# Patient Record
Sex: Female | Born: 1945 | Race: White | Hispanic: No | Marital: Married | Smoking: Never smoker
Health system: Southern US, Community
[De-identification: ages and names within clinical notes are randomized; demographics above are authoritative.]

## PROBLEM LIST (undated history)

## (undated) DIAGNOSIS — K219 Gastro-esophageal reflux disease without esophagitis: Secondary | ICD-10-CM

## (undated) DIAGNOSIS — M1990 Unspecified osteoarthritis, unspecified site: Secondary | ICD-10-CM

## (undated) DIAGNOSIS — E7800 Pure hypercholesterolemia, unspecified: Secondary | ICD-10-CM

## (undated) DIAGNOSIS — I10 Essential (primary) hypertension: Secondary | ICD-10-CM

## (undated) DIAGNOSIS — E039 Hypothyroidism, unspecified: Secondary | ICD-10-CM

## (undated) DIAGNOSIS — N200 Calculus of kidney: Secondary | ICD-10-CM

## (undated) HISTORY — PX: TUBAL LIGATION: SHX77

## (undated) HISTORY — PX: ABDOMINAL HYSTERECTOMY: SHX81

## (undated) HISTORY — PX: KIDNEY STONE SURGERY: SHX686

## (undated) HISTORY — DX: Hypothyroidism, unspecified: E03.9

## (undated) HISTORY — PX: CARPAL TUNNEL RELEASE: SHX101

## (undated) HISTORY — PX: APPENDECTOMY: SHX54

## (undated) HISTORY — DX: Essential (primary) hypertension: I10

## (undated) HISTORY — DX: Pure hypercholesterolemia, unspecified: E78.00

## (undated) HISTORY — DX: Gastro-esophageal reflux disease without esophagitis: K21.9

## (undated) HISTORY — PX: CHOLECYSTECTOMY: SHX55

## (undated) SURGERY — EGD (ESOPHAGOGASTRODUODENOSCOPY)
Anesthesia: Moderate Sedation

---

## 2018-04-25 ENCOUNTER — Encounter (INDEPENDENT_AMBULATORY_CARE_PROVIDER_SITE_OTHER): Payer: Self-pay | Admitting: *Deleted

## 2018-04-25 ENCOUNTER — Ambulatory Visit (INDEPENDENT_AMBULATORY_CARE_PROVIDER_SITE_OTHER): Payer: Medicare Other | Admitting: Internal Medicine

## 2018-04-25 ENCOUNTER — Encounter (INDEPENDENT_AMBULATORY_CARE_PROVIDER_SITE_OTHER): Payer: Self-pay | Admitting: Internal Medicine

## 2018-04-25 VITALS — BP 172/80 | HR 64 | Temp 97.9°F | Ht 60.0 in | Wt 141.1 lb

## 2018-04-25 DIAGNOSIS — E7800 Pure hypercholesterolemia, unspecified: Secondary | ICD-10-CM

## 2018-04-25 DIAGNOSIS — E039 Hypothyroidism, unspecified: Secondary | ICD-10-CM

## 2018-04-25 DIAGNOSIS — K219 Gastro-esophageal reflux disease without esophagitis: Secondary | ICD-10-CM | POA: Diagnosis not present

## 2018-04-25 DIAGNOSIS — I10 Essential (primary) hypertension: Secondary | ICD-10-CM | POA: Insufficient documentation

## 2018-04-25 DIAGNOSIS — R1310 Dysphagia, unspecified: Secondary | ICD-10-CM

## 2018-04-25 DIAGNOSIS — R1319 Other dysphagia: Secondary | ICD-10-CM

## 2018-04-25 HISTORY — DX: Pure hypercholesterolemia, unspecified: E78.00

## 2018-04-25 HISTORY — DX: Hypothyroidism, unspecified: E03.9

## 2018-04-25 HISTORY — DX: Gastro-esophageal reflux disease without esophagitis: K21.9

## 2018-04-25 HISTORY — DX: Essential (primary) hypertension: I10

## 2018-04-25 MED ORDER — PANTOPRAZOLE SODIUM 40 MG PO TBEC: 40.0000 mg | DELAYED_RELEASE_TABLET | Freq: Every day | ORAL | 1 refills | Status: AC

## 2018-04-25 NOTE — Patient Instructions (Addendum)
Rx for Protonix 40mg  daily. Take Zantac at hs. DG Esophagram.

## 2018-04-25 NOTE — Progress Notes (Signed)
   Subjective:    Patient ID: Lauren Smith, female    DOB: 01-09-1946, 72 y.o.   MRN: [id number]  HPI Referred by Olena Mater for GERD/colon polyps. Her last colonoscopy was in December of 2014 for hx of colon polyps. (Dr. Davina Poke)   Normal colonoscopy. Family hx of colon cancer in father age 55. She tells me she has burning in her epigastric region. GERD is more frequent. She says sometimes foods are lodging at time. She says her esophagus feel irritated.  Symptoms gradually worsened over the years.  Her appetite is okay. No weight loss. Has a BM daily. Sometimes she does have cramps with her BMs. No melena or BRRB.   Retired from [employer] Past Medical History:  Diagnosis Date  . GERD (gastroesophageal reflux disease) 04/25/2018  . High blood pressure 04/25/2018  . High cholesterol 04/25/2018  . Hypothyroidism 04/25/2018    Past Surgical History:  Procedure Laterality Date  . ABDOMINAL HYSTERECTOMY     One ovary left.   . APPENDECTOMY     Carcinold  . CARPAL TUNNEL RELEASE     bilateral   . CHOLECYSTECTOMY    . KIDNEY STONE SURGERY     lithro  . TUBAL LIGATION      Allergies  Allergen Reactions  . Penicillins     hives    Current Outpatient Medications on File Prior to Visit  Medication Sig Dispense Refill  . b complex vitamins capsule Take 1 capsule by mouth daily.    . Cholecalciferol (VITAMIN D3) 1000 units CAPS Take 5,000 Units by mouth.    . Coenzyme Q10 10 MG capsule Take 10 mg by mouth daily.    Marland Kitchen LACTOBACILLUS BIFIDUS PO Take by mouth.    . levothyroxine (SYNTHROID, LEVOTHROID) 75 MCG tablet Take 88 mcg by mouth daily before breakfast.     . lisinopril (PRINIVIL,ZESTRIL) 10 MG tablet Take 10 mg by mouth daily.    . Multiple Vitamin (MULTIVITAMIN) tablet Take 1 tablet by mouth daily.    . Multiple Vitamins-Minerals (OCUVITE ADULT 50+ PO) Take by mouth.    . ranitidine (ZANTAC) 150 MG tablet Take 150 mg by mouth 2 (two)  times daily.    . Red Yeast Rice Extract 600 MG CAPS Take by mouth.     No current facility-administered medications on file prior to visit.         Objective:   Physical Exam Blood pressure (!) 200/80, pulse 64, temperature 97.9 F (36.6 C), height 5' (1.524 m), weight 141 lb 1.6 oz (64 kg). Alert and oriented. Skin warm and dry. Oral mucosa is moist.   . Sclera anicteric, conjunctivae is pink. Thyroid not enlarged. No cervical lymphadenopathy. Lungs clear. Heart regular rate and rhythm.  Abdomen is soft. Bowel sounds are positive. No hepatomegaly. No abdominal masses felt. No tenderness.  No edema to lower extremities.          Assessment & Plan:  Dysphagia.  Am going to get a DG esophagram. Am going to switch her to Protonix. Can take Zantac at night.  Will get DG esophagram first before we schedule the colonoscopy. May need an EGD/ED.

## 2018-04-29 ENCOUNTER — Ambulatory Visit (HOSPITAL_COMMUNITY): Payer: Medicare Other

## 2018-05-05 ENCOUNTER — Ambulatory Visit (HOSPITAL_COMMUNITY)
Admission: RE | Admit: 2018-05-05 | Discharge: 2018-05-05 | Disposition: A | Discharge status: Home / Self Care | Payer: Medicare Other | Source: Ambulatory Visit | Attending: Internal Medicine | Admitting: Internal Medicine

## 2018-05-05 ENCOUNTER — Other Ambulatory Visit (INDEPENDENT_AMBULATORY_CARE_PROVIDER_SITE_OTHER): Payer: Self-pay | Admitting: Internal Medicine

## 2018-05-05 ENCOUNTER — Encounter (HOSPITAL_COMMUNITY): Payer: Self-pay | Admitting: Radiology

## 2018-05-05 ENCOUNTER — Encounter (INDEPENDENT_AMBULATORY_CARE_PROVIDER_SITE_OTHER): Payer: Self-pay | Admitting: *Deleted

## 2018-05-05 DIAGNOSIS — R1310 Dysphagia, unspecified: Secondary | ICD-10-CM

## 2018-05-05 DIAGNOSIS — K222 Esophageal obstruction: Secondary | ICD-10-CM | POA: Diagnosis not present

## 2018-05-05 DIAGNOSIS — R1319 Other dysphagia: Secondary | ICD-10-CM

## 2018-05-05 DIAGNOSIS — K449 Diaphragmatic hernia without obstruction or gangrene: Secondary | ICD-10-CM | POA: Diagnosis not present

## 2018-05-05 DIAGNOSIS — R933 Abnormal findings on diagnostic imaging of other parts of digestive tract: Secondary | ICD-10-CM

## 2018-05-05 DIAGNOSIS — Z860100 Personal history of colon polyps, unspecified: Secondary | ICD-10-CM

## 2018-05-05 NOTE — Telephone Encounter (Signed)
error    This encounter was created in error - please disregard.

## 2018-05-11 ENCOUNTER — Other Ambulatory Visit (INDEPENDENT_AMBULATORY_CARE_PROVIDER_SITE_OTHER): Payer: Self-pay | Admitting: Internal Medicine

## 2018-05-11 ENCOUNTER — Telehealth (INDEPENDENT_AMBULATORY_CARE_PROVIDER_SITE_OTHER): Payer: Self-pay | Admitting: *Deleted

## 2018-05-11 ENCOUNTER — Telehealth (INDEPENDENT_AMBULATORY_CARE_PROVIDER_SITE_OTHER): Payer: Self-pay | Admitting: Internal Medicine

## 2018-05-11 ENCOUNTER — Encounter (INDEPENDENT_AMBULATORY_CARE_PROVIDER_SITE_OTHER): Payer: Self-pay | Admitting: *Deleted

## 2018-05-11 DIAGNOSIS — R1310 Dysphagia, unspecified: Secondary | ICD-10-CM

## 2018-05-11 DIAGNOSIS — Z860100 Personal history of colon polyps, unspecified: Secondary | ICD-10-CM | POA: Insufficient documentation

## 2018-05-11 DIAGNOSIS — R933 Abnormal findings on diagnostic imaging of other parts of digestive tract: Secondary | ICD-10-CM | POA: Insufficient documentation

## 2018-05-11 MED ORDER — PEG 3350-KCL-NA BICARB-NACL 420 G PO SOLR: 4000.0000 mL | Freq: Once | ORAL | 0 refills | Status: AC

## 2018-05-11 NOTE — Telephone Encounter (Signed)
Patient needs trilyte 

## 2018-05-11 NOTE — Telephone Encounter (Signed)
EGD,possible ED, colonoscopy with propofol

## 2018-05-11 NOTE — Telephone Encounter (Signed)
TCS/EGD sch'd 06/17/18 1030 (900), preop 11/18 at 11, left detailed message for patient, instructions mailed

## 2018-06-07 NOTE — Patient Instructions (Signed)
Lauren Smith  06/07/2018     @PREFPERIOPPHARMACY @   Your procedure is scheduled on  06/17/2018 .  Report to [facility] at  700   A.M.  Call this number if you have problems the morning of surgery:  [phone]   Remember:  Follow the diet and prep instructions given to you by Dr Olevia Perches office.                      Take these medicines the morning of surgery with A SIP OF WATER  Levothyroxine, lisinopril, protonix, zantac.    Do not wear jewelry, make-up or nail polish.  Do not wear lotions, powders, or perfumes, or deodorant.  Do not shave 48 hours prior to surgery.  Men may shave face and neck.  Do not bring valuables to the hospital.  [facility] is not responsible for any belongings or valuables.  Contacts, dentures or bridgework may not be worn into surgery.  Leave your suitcase in the car.  After surgery it may be brought to your room.  For patients admitted to the hospital, discharge time will be determined by your treatment team.  Patients discharged the day of surgery will not be allowed to drive home.   Name and phone number of your driver:   family Special instructions:  None Please read over the following fact sheets that you were given. Anesthesia Post-op Instructions and Care and Recovery After Surgery       Esophagogastroduodenoscopy Esophagogastroduodenoscopy (EGD) is a procedure to examine the lining of the esophagus, stomach, and first part of the small intestine (duodenum). This procedure is done to check for problems such as inflammation, bleeding, ulcers, or growths. During this procedure, a long, flexible, lighted tube with a camera attached (endoscope) is inserted down the throat. Tell a health care provider about:  Any allergies you have.  All medicines you are taking, including vitamins, herbs, eye drops, creams, and over-the-counter medicines.  Any problems you or family members have had with anesthetic  medicines.  Any blood disorders you have.  Any surgeries you have had.  Any medical conditions you have.  Whether you are pregnant or may be pregnant. What are the risks? Generally, this is a safe procedure. However, problems may occur, including:  Infection.  Bleeding.  A tear (perforation) in the esophagus, stomach, or duodenum.  Trouble breathing.  Excessive sweating.  Spasms of the larynx.  A slowed heartbeat.  Low blood pressure.  What happens before the procedure?  Follow instructions from your health care provider about eating or drinking restrictions.  Ask your health care provider about: ? Changing or stopping your regular medicines. This is especially important if you are taking diabetes medicines or blood thinners. ? Taking medicines such as aspirin and ibuprofen. These medicines can thin your blood. Do not take these medicines before your procedure if your health care provider instructs you not to.  Plan to have someone take you home after the procedure.  If you wear dentures, be ready to remove them before the procedure. What happens during the procedure?  To reduce your risk of infection, your health care team will wash or sanitize their hands.  An IV tube will be put in a vein in your hand or arm. You will get medicines and fluids through this tube.  You will be given one or more of the following: ?  A medicine to help you relax (sedative). ? A medicine to numb the area (local anesthetic). This medicine may be sprayed into your throat. It will make you feel more comfortable and keep you from gagging or coughing during the procedure. ? A medicine for pain.  A mouth guard may be placed in your mouth to protect your teeth and to keep you from biting on the endoscope.  You will be asked to lie on your left side.  The endoscope will be lowered down your throat into your esophagus, stomach, and duodenum.  Air will be put into the endoscope. This will  help your health care provider see better.  The lining of your esophagus, stomach, and duodenum will be examined.  Your health care provider may: ? Take a tissue sample so it can be looked at in a lab (biopsy). ? Remove growths. ? Remove objects (foreign bodies) that are stuck. ? Treat any bleeding with medicines or other devices that stop tissue from bleeding. ? Widen (dilate) or stretch narrowed areas of your esophagus and stomach.  The endoscope will be taken out. The procedure may vary among health care providers and hospitals. What happens after the procedure?  Your blood pressure, heart rate, breathing rate, and blood oxygen level will be monitored often until the medicines you were given have worn off.  Do not eat or drink anything until the numbing medicine has worn off and your gag reflex has returned. This information is not intended to replace advice given to you by your health care provider. Make sure you discuss any questions you have with your health care provider. Document Released: 11/13/2004 Document Revised: 12/19/2015 Document Reviewed: 06/06/2015 Elsevier Interactive Patient Education  2018 [employer]. Esophagogastroduodenoscopy, Care After Refer to this sheet in the next few weeks. These instructions provide you with information about caring for yourself after your procedure. Your health care provider may also give you more specific instructions. Your treatment has been planned according to current medical practices, but problems sometimes occur. Call your health care provider if you have any problems or questions after your procedure. What can I expect after the procedure? After the procedure, it is common to have:  A sore throat.  Nausea.  Bloating.  Dizziness.  Fatigue.  Follow these instructions at home:  Do not eat or drink anything until the numbing medicine (local anesthetic) has worn off and your gag reflex has returned. You will know that the  local anesthetic has worn off when you can swallow comfortably.  Do not drive for 24 hours if you received a medicine to help you relax (sedative).  If your health care provider took a tissue sample for testing during the procedure, make sure to get your test results. This is your responsibility. Ask your health care provider or the department performing the test when your results will be ready.  Keep all follow-up visits as told by your health care provider. This is important. Contact a health care provider if:  You cannot stop coughing.  You are not urinating.  You are urinating less than usual. Get help right away if:  You have trouble swallowing.  You cannot eat or drink.  You have throat or chest pain that gets worse.  You are dizzy or light-headed.  You faint.  You have nausea or vomiting.  You have chills.  You have a fever.  You have severe abdominal pain.  You have black, tarry, or bloody stools. This information is not intended to  replace advice given to you by your health care provider. Make sure you discuss any questions you have with your health care provider. Document Released: 06/29/2012 Document Revised: 12/19/2015 Document Reviewed: 06/06/2015 Elsevier Interactive Patient Education  2018 [employer].  Esophageal Dilatation Esophageal dilatation is a procedure to open a blocked or narrowed part of the esophagus. The esophagus is the long tube in your throat that carries food and liquid from your mouth to your stomach. The procedure is also called esophageal dilation. You may need this procedure if you have a buildup of scar tissue in your esophagus that makes it difficult, painful, or even impossible to swallow. This can be caused by gastroesophageal reflux disease (GERD). In rare cases, people need this procedure because they have cancer of the esophagus or a problem with the way food moves through the esophagus. Sometimes you may need to have another  dilatation to enlarge the opening of the esophagus gradually. Tell a health care provider about:  Any allergies you have.  All medicines you are taking, including vitamins, herbs, eye drops, creams, and over-the-counter medicines.  Any problems you or family members have had with anesthetic medicines.  Any blood disorders you have.  Any surgeries you have had.  Any medical conditions you have.  Any antibiotic medicines you are required to take before dental procedures. What are the risks? Generally, this is a safe procedure. However, problems can occur and include:  Bleeding from a tear in the lining of the esophagus.  A hole (perforation) in the esophagus.  What happens before the procedure?  Do not eat or drink anything after midnight on the night before the procedure or as directed by your health care provider.  Ask your health care provider about changing or stopping your regular medicines. This is especially important if you are taking diabetes medicines or blood thinners.  Plan to have someone take you home after the procedure. What happens during the procedure?  You will be given a medicine that makes you relaxed and sleepy (sedative).  A medicine may be sprayed or gargled to numb the back of the throat.  Your health care provider can use various instruments to do an esophageal dilatation. During the procedure, the instrument used will be placed in your mouth and passed down into your esophagus. Options include: ? Simple dilators. This instrument is carefully placed in the esophagus to stretch it. ? Guided wire bougies. In this method, a flexible tube (endoscope) is used to insert a wire into the esophagus. The dilator is passed over this wire to enlarge the esophagus. Then the wire is removed. ? Balloon dilators. An endoscope with a small balloon at the end is passed down into the esophagus. Inflating the balloon gently stretches the esophagus and opens it up. What  happens after the procedure?  Your blood pressure, heart rate, breathing rate, and blood oxygen level will be monitored often until the medicines you were given have worn off.  Your throat may feel slightly sore and will probably still feel numb. This will improve slowly over time.  You will not be allowed to eat or drink until the throat numbness has resolved.  If this is a same-day procedure, you may be allowed to go home once you have been able to drink, urinate, and sit on the edge of the bed without nausea or dizziness.  If this is a same-day procedure, you should have a friend or family member with you for the next 24 hours after  the procedure. This information is not intended to replace advice given to you by your health care provider. Make sure you discuss any questions you have with your health care provider. Document Released: 09/03/2005 Document Revised: 12/19/2015 Document Reviewed: 11/22/2013 Elsevier Interactive Patient Education  [employer].  Colonoscopy, Adult A colonoscopy is an exam to look at the large intestine. It is done to check for problems, such as:  Lumps (tumors).  Growths (polyps).  Swelling (inflammation).  Bleeding.  What happens before the procedure? Eating and drinking Follow instructions from your doctor about eating and drinking. These instructions may include:  A few days before the procedure - follow a low-fiber diet. ? Avoid nuts. ? Avoid seeds. ? Avoid dried fruit. ? Avoid raw fruits. ? Avoid vegetables.  1-3 days before the procedure - follow a clear liquid diet. Avoid liquids that have red or purple dye. Drink only clear liquids, such as: ? Clear broth or bouillon. ? Black coffee or tea. ? Clear juice. ? Clear soft drinks or sports drinks. ? Gelatin dessert. ? Popsicles.  On the day of the procedure - do not eat or drink anything during the 2 hours before the procedure.  Bowel prep If you were prescribed an oral bowel  prep:  Take it as told by your doctor. Starting the day before your procedure, you will need to drink a lot of liquid. The liquid will cause you to poop (have bowel movements) until your poop is almost clear or light green.  If your skin or butt gets irritated from diarrhea, you may: ? Wipe the area with wipes that have medicine in them, such as adult wet wipes with aloe and vitamin E. ? Put something on your skin that soothes the area, such as petroleum jelly.  If you throw up (vomit) while drinking the bowel prep, take a break for up to 60 minutes. Then begin the bowel prep again. If you keep throwing up and you cannot take the bowel prep without throwing up, call your doctor.  General instructions  Ask your doctor about changing or stopping your normal medicines. This is important if you take diabetes medicines or blood thinners.  Plan to have someone take you home from the hospital or clinic. What happens during the procedure?  An IV tube may be put into one of your veins.  You will be given medicine to help you relax (sedative).  To reduce your risk of infection: ? Your doctors will wash their hands. ? Your anal area will be washed with soap.  You will be asked to lie on your side with your knees bent.  Your doctor will get a long, thin, flexible tube ready. The tube will have a camera and a light on the end.  The tube will be put into your anus.  The tube will be gently put into your large intestine.  Air will be delivered into your large intestine to keep it open. You may feel some pressure or cramping.  The camera will be used to take photos.  A small tissue sample may be removed from your body to be looked at under a microscope (biopsy). If any possible problems are found, the tissue will be sent to a lab for testing.  If small growths are found, your doctor may remove them and have them checked for cancer.  The tube that was put into your anus will be slowly  removed. The procedure may vary among doctors and hospitals. What happens  after the procedure?  Your doctor will check on you often until the medicines you were given have worn off.  Do not drive for 24 hours after the procedure.  You may have a small amount of blood in your poop.  You may pass gas.  You may have mild cramps or bloating in your belly (abdomen).  It is up to you to get the results of your procedure. Ask your doctor, or the department performing the procedure, when your results will be ready. This information is not intended to replace advice given to you by your health care provider. Make sure you discuss any questions you have with your health care provider. Document Released: 08/15/2010 Document Revised: 05/13/2016 Document Reviewed: 09/24/2015 Elsevier Interactive Patient Education  2017 [employer]  Colonoscopy, Adult, Care After This sheet gives you information about how to care for yourself after your procedure. Your health care provider may also give you more specific instructions. If you have problems or questions, contact your health care provider. What can I expect after the procedure? After the procedure, it is common to have:  A small amount of blood in your stool for 24 hours after the procedure.  Some gas.  Mild abdominal cramping or bloating.  Follow these instructions at home: General instructions   For the first 24 hours after the procedure: ? Do not drive or use machinery. ? Do not sign important documents. ? Do not drink alcohol. ? Do your regular daily activities at a slower pace than normal. ? Eat soft, easy-to-digest foods. ? Rest often.  Take over-the-counter or prescription medicines only as told by your health care provider.  It is up to you to get the results of your procedure. Ask your health care provider, or the department performing the procedure, when your results will be ready. Relieving cramping and bloating  Try  walking around when you have cramps or feel bloated.  Apply heat to your abdomen as told by your health care provider. Use a heat source that your health care provider recommends, such as a moist heat pack or a heating pad. ? Place a towel between your skin and the heat source. ? Leave the heat on for 20-30 minutes. ? Remove the heat if your skin turns bright red. This is especially important if you are unable to feel pain, heat, or cold. You may have a greater risk of getting burned. Eating and drinking  Drink enough fluid to keep your urine clear or pale yellow.  Resume your normal diet as instructed by your health care provider. Avoid heavy or fried foods that are hard to digest.  Avoid drinking alcohol for as long as instructed by your health care provider. Contact a health care provider if:  You have blood in your stool 2-3 days after the procedure. Get help right away if:  You have more than a small spotting of blood in your stool.  You pass large blood clots in your stool.  Your abdomen is swollen.  You have nausea or vomiting.  You have a fever.  You have increasing abdominal pain that is not relieved with medicine. This information is not intended to replace advice given to you by your health care provider. Make sure you discuss any questions you have with your health care provider. Document Released: 02/25/2004 Document Revised: 04/06/2016 Document Reviewed: 09/24/2015 Elsevier Interactive Patient Education  2018 [place] Anesthesia is a term that refers to techniques, procedures, and medicines that  help a person stay safe and comfortable during a medical procedure. Monitored anesthesia care, or sedation, is one type of anesthesia. Your anesthesia specialist may recommend sedation if you will be having a procedure that does not require you to be unconscious, such as:  Cataract surgery.  A dental procedure.  A biopsy.  A  colonoscopy.  During the procedure, you may receive a medicine to help you relax (sedative). There are three levels of sedation:  Mild sedation. At this level, you may feel awake and relaxed. You will be able to follow directions.  Moderate sedation. At this level, you will be sleepy. You may not remember the procedure.  Deep sedation. At this level, you will be asleep. You will not remember the procedure.  The more medicine you are given, the deeper your level of sedation will be. Depending on how you respond to the procedure, the anesthesia specialist may change your level of sedation or the type of anesthesia to fit your needs. An anesthesia specialist will monitor you closely during the procedure. Let your health care provider know about:  Any allergies you have.  All medicines you are taking, including vitamins, herbs, eye drops, creams, and over-the-counter medicines.  Any use of steroids (by mouth or as a cream).  Any problems you or family members have had with sedatives and anesthetic medicines.  Any blood disorders you have.  Any surgeries you have had.  Any medical conditions you have, such as sleep apnea.  Whether you are pregnant or may be pregnant.  Any use of cigarettes, alcohol, or street drugs. What are the risks? Generally, this is a safe procedure. However, problems may occur, including:  Getting too much medicine (oversedation).  Nausea.  Allergic reaction to medicines.  Trouble breathing. If this happens, a breathing tube may be used to help with breathing. It will be removed when you are awake and breathing on your own.  Heart trouble.  Lung trouble.  Before the procedure Staying hydrated Follow instructions from your health care provider about hydration, which may include:  Up to 2 hours before the procedure - you may continue to drink clear liquids, such as water, clear fruit juice, black coffee, and plain tea.  Eating and drinking  restrictions Follow instructions from your health care provider about eating and drinking, which may include:  8 hours before the procedure - stop eating heavy meals or foods such as meat, fried foods, or fatty foods.  6 hours before the procedure - stop eating light meals or foods, such as toast or cereal.  6 hours before the procedure - stop drinking milk or drinks that contain milk.  2 hours before the procedure - stop drinking clear liquids.  Medicines Ask your health care provider about:  Changing or stopping your regular medicines. This is especially important if you are taking diabetes medicines or blood thinners.  Taking medicines such as aspirin and ibuprofen. These medicines can thin your blood. Do not take these medicines before your procedure if your health care provider instructs you not to.  Tests and exams  You will have a physical exam.  You may have blood tests done to show: ? How well your kidneys and liver are working. ? How well your blood can clot.  General instructions  Plan to have someone take you home from the hospital or clinic.  If you will be going home right after the procedure, plan to have someone with you for 24 hours.  What happens during  the procedure?  Your blood pressure, heart rate, breathing, level of pain and overall condition will be monitored.  An IV tube will be inserted into one of your veins.  Your anesthesia specialist will give you medicines as needed to keep you comfortable during the procedure. This may mean changing the level of sedation.  The procedure will be performed. After the procedure  Your blood pressure, heart rate, breathing rate, and blood oxygen level will be monitored until the medicines you were given have worn off.  Do not drive for 24 hours if you received a sedative.  You may: ? Feel sleepy, clumsy, or nauseous. ? Feel forgetful about what happened after the procedure. ? Have a sore throat if you had a  breathing tube during the procedure. ? Vomit. This information is not intended to replace advice given to you by your health care provider. Make sure you discuss any questions you have with your health care provider. Document Released: 04/08/2005 Document Revised: 12/20/2015 Document Reviewed: 11/03/2015 Elsevier Interactive Patient Education  2018 [place], Care After These instructions provide you with information about caring for yourself after your procedure. Your health care provider may also give you more specific instructions. Your treatment has been planned according to current medical practices, but problems sometimes occur. Call your health care provider if you have any problems or questions after your procedure. What can I expect after the procedure? After your procedure, it is common to:  Feel sleepy for several hours.  Feel clumsy and have poor balance for several hours.  Feel forgetful about what happened after the procedure.  Have poor judgment for several hours.  Feel nauseous or vomit.  Have a sore throat if you had a breathing tube during the procedure.  Follow these instructions at home: For at least 24 hours after the procedure:   Do not: ? Participate in activities in which you could fall or become injured. ? Drive. ? Use heavy machinery. ? Drink alcohol. ? Take sleeping pills or medicines that cause drowsiness. ? Make important decisions or sign legal documents. ? Take care of children on your own.  Rest. Eating and drinking  Follow the diet that is recommended by your health care provider.  If you vomit, drink water, juice, or soup when you can drink without vomiting.  Make sure you have little or no nausea before eating solid foods. General instructions  Have a responsible adult stay with you until you are awake and alert.  Take over-the-counter and prescription medicines only as told by your health care  provider.  If you smoke, do not smoke without supervision.  Keep all follow-up visits as told by your health care provider. This is important. Contact a health care provider if:  You keep feeling nauseous or you keep vomiting.  You feel light-headed.  You develop a rash.  You have a fever. Get help right away if:  You have trouble breathing. This information is not intended to replace advice given to you by your health care provider. Make sure you discuss any questions you have with your health care provider. Document Released: 11/03/2015 Document Revised: 03/04/2016 Document Reviewed: 11/03/2015 Elsevier Interactive Patient Education  [employer].

## 2018-06-13 ENCOUNTER — Encounter (HOSPITAL_COMMUNITY)
Admission: RE | Admit: 2018-06-13 | Discharge: 2018-06-13 | Disposition: A | Discharge status: Home / Self Care | Payer: Medicare Other | Source: Ambulatory Visit | Attending: Internal Medicine | Admitting: Internal Medicine

## 2018-06-13 ENCOUNTER — Other Ambulatory Visit: Payer: Self-pay

## 2018-06-13 ENCOUNTER — Encounter (HOSPITAL_COMMUNITY): Payer: Self-pay

## 2018-06-13 DIAGNOSIS — Z01818 Encounter for other preprocedural examination: Secondary | ICD-10-CM | POA: Diagnosis present

## 2018-06-13 HISTORY — DX: Calculus of kidney: N20.0

## 2018-06-13 HISTORY — DX: Unspecified osteoarthritis, unspecified site: M19.90

## 2018-06-13 LAB — CBC WITH DIFFERENTIAL/PLATELET
BASOS ABS: 0 10*3/uL (ref 0.0–0.1)
BASOS PCT: 0 %
EOS ABS: 0 10*3/uL (ref 0.0–0.5)
Eosinophils Relative: 0 %
HCT: 43.8 % (ref 36.0–46.0)
HEMOGLOBIN: 14.8 g/dL (ref 12.0–15.0)
Lymphocytes Relative: 22 %
Lymphs Abs: 1.6 10*3/uL (ref 0.7–4.0)
MCH: 29.5 pg (ref 26.0–34.0)
MCHC: 33.8 g/dL (ref 30.0–36.0)
MCV: 87.4 fL (ref 80.0–100.0)
Monocytes Absolute: 0.9 10*3/uL (ref 0.1–1.0)
Monocytes Relative: 13 %
NEUTROS PCT: 65 %
NRBC: 0 % (ref 0.0–0.2)
Neutro Abs: 4.6 10*3/uL (ref 1.7–7.7)
Platelets: 250 10*3/uL (ref 150–400)
RBC: 5.01 MIL/uL (ref 3.87–5.11)
RDW: 12.9 % (ref 11.5–15.5)
WBC: 7.1 10*3/uL (ref 4.0–10.5)

## 2018-06-13 LAB — BASIC METABOLIC PANEL
Anion gap: 9 (ref 5–15)
BUN: 19 mg/dL (ref 8–23)
CHLORIDE: 106 mmol/L (ref 98–111)
CO2: 25 mmol/L (ref 22–32)
CREATININE: 0.83 mg/dL (ref 0.44–1.00)
Calcium: 9.5 mg/dL (ref 8.9–10.3)
GFR calc non Af Amer: >60 mL/min (ref >60)
Glucose, Bld: 120 mg/dL — ABNORMAL HIGH (ref 70–99)
Potassium: 4 mmol/L (ref 3.5–5.1)
SODIUM: 140 mmol/L (ref 135–145)

## 2018-06-17 ENCOUNTER — Ambulatory Visit (HOSPITAL_COMMUNITY)
Admission: RE | Admit: 2018-06-17 | Discharge: 2018-06-17 | Disposition: A | Discharge status: Home / Self Care | Payer: Medicare Other | Source: Ambulatory Visit | Attending: Internal Medicine | Admitting: Internal Medicine

## 2018-06-17 ENCOUNTER — Ambulatory Visit (HOSPITAL_COMMUNITY): Payer: Medicare Other | Admitting: Anesthesiology

## 2018-06-17 ENCOUNTER — Encounter (HOSPITAL_COMMUNITY)
Admission: RE | Disposition: A | Discharge status: Home / Self Care | Payer: Self-pay | Source: Ambulatory Visit | Attending: Internal Medicine

## 2018-06-17 ENCOUNTER — Encounter (HOSPITAL_COMMUNITY): Payer: Self-pay | Admitting: Anesthesiology

## 2018-06-17 DIAGNOSIS — Z1211 Encounter for screening for malignant neoplasm of colon: Secondary | ICD-10-CM | POA: Diagnosis not present

## 2018-06-17 DIAGNOSIS — Z860100 Personal history of colon polyps, unspecified: Secondary | ICD-10-CM | POA: Insufficient documentation

## 2018-06-17 DIAGNOSIS — K222 Esophageal obstruction: Secondary | ICD-10-CM

## 2018-06-17 DIAGNOSIS — K317 Polyp of stomach and duodenum: Secondary | ICD-10-CM

## 2018-06-17 DIAGNOSIS — R933 Abnormal findings on diagnostic imaging of other parts of digestive tract: Secondary | ICD-10-CM

## 2018-06-17 DIAGNOSIS — E039 Hypothyroidism, unspecified: Secondary | ICD-10-CM | POA: Insufficient documentation

## 2018-06-17 DIAGNOSIS — Z79890 Hormone replacement therapy (postmenopausal): Secondary | ICD-10-CM | POA: Insufficient documentation

## 2018-06-17 DIAGNOSIS — K449 Diaphragmatic hernia without obstruction or gangrene: Secondary | ICD-10-CM | POA: Diagnosis not present

## 2018-06-17 DIAGNOSIS — Z800 Family history of malignant neoplasm of digestive organs: Secondary | ICD-10-CM | POA: Diagnosis not present

## 2018-06-17 DIAGNOSIS — R1314 Dysphagia, pharyngoesophageal phase: Secondary | ICD-10-CM | POA: Insufficient documentation

## 2018-06-17 DIAGNOSIS — K219 Gastro-esophageal reflux disease without esophagitis: Secondary | ICD-10-CM | POA: Insufficient documentation

## 2018-06-17 DIAGNOSIS — Z880 Allergy status to penicillin: Secondary | ICD-10-CM | POA: Insufficient documentation

## 2018-06-17 DIAGNOSIS — I10 Essential (primary) hypertension: Secondary | ICD-10-CM | POA: Diagnosis not present

## 2018-06-17 DIAGNOSIS — Z09 Encounter for follow-up examination after completed treatment for conditions other than malignant neoplasm: Secondary | ICD-10-CM

## 2018-06-17 DIAGNOSIS — K5730 Diverticulosis of large intestine without perforation or abscess without bleeding: Secondary | ICD-10-CM | POA: Diagnosis not present

## 2018-06-17 DIAGNOSIS — D122 Benign neoplasm of ascending colon: Secondary | ICD-10-CM | POA: Insufficient documentation

## 2018-06-17 DIAGNOSIS — Z79899 Other long term (current) drug therapy: Secondary | ICD-10-CM | POA: Insufficient documentation

## 2018-06-17 DIAGNOSIS — R1310 Dysphagia, unspecified: Secondary | ICD-10-CM | POA: Insufficient documentation

## 2018-06-17 HISTORY — PX: COLONOSCOPY WITH PROPOFOL: SHX5780

## 2018-06-17 HISTORY — PX: BIOPSY: SHX5522

## 2018-06-17 HISTORY — PX: ESOPHAGOGASTRODUODENOSCOPY (EGD) WITH PROPOFOL: SHX5813

## 2018-06-17 HISTORY — PX: ESOPHAGEAL DILATION: SHX303

## 2018-06-17 HISTORY — PX: POLYPECTOMY: SHX5525

## 2018-06-17 SURGERY — COLONOSCOPY WITH PROPOFOL
Anesthesia: General

## 2018-06-17 MED ORDER — HYDROCODONE-ACETAMINOPHEN 7.5-325 MG PO TABS: 1.0000 | ORAL_TABLET | Freq: Once | ORAL | Status: DC | PRN

## 2018-06-17 MED ORDER — LACTATED RINGERS IV SOLN
INTRAVENOUS | Status: DC
  Administered 2018-06-17: 08:00:00 via INTRAVENOUS
  Administered 2018-06-17: 1000 mL via INTRAVENOUS

## 2018-06-17 MED ORDER — PROPOFOL 10 MG/ML IV BOLUS
INTRAVENOUS | Status: AC
  Filled 2018-06-17: qty 20

## 2018-06-17 MED ORDER — PROPOFOL 500 MG/50ML IV EMUL
INTRAVENOUS | Status: DC | PRN
  Administered 2018-06-17 (×2): via INTRAVENOUS
  Administered 2018-06-17: 150 ug/kg/min via INTRAVENOUS

## 2018-06-17 MED ORDER — PROPOFOL 10 MG/ML IV BOLUS
INTRAVENOUS | Status: DC | PRN
  Administered 2018-06-17 (×4): 20 mg via INTRAVENOUS

## 2018-06-17 MED ORDER — MIDAZOLAM HCL 2 MG/2ML IJ SOLN: 0.5000 mg | Freq: Once | INTRAMUSCULAR | Status: DC | PRN

## 2018-06-17 MED ORDER — LIDOCAINE HCL (PF) 1 % IJ SOLN
INTRAMUSCULAR | Status: AC
  Filled 2018-06-17: qty 15

## 2018-06-17 MED ORDER — PROMETHAZINE HCL 25 MG/ML IJ SOLN: 6.2500 mg | INTRAMUSCULAR | Status: DC | PRN

## 2018-06-17 MED ORDER — LIDOCAINE VISCOUS HCL 2 % MT SOLN
6.0000 mL | Freq: Once | OROMUCOSAL | Status: AC
  Administered 2018-06-17: 6 mL via OROMUCOSAL

## 2018-06-17 MED ORDER — STERILE WATER FOR IRRIGATION IR SOLN
Status: DC | PRN
  Administered 2018-06-17: 1.5 mL

## 2018-06-17 MED ORDER — LIDOCAINE VISCOUS HCL 2 % MT SOLN
OROMUCOSAL | Status: AC
  Filled 2018-06-17: qty 15

## 2018-06-17 MED ORDER — HYDROMORPHONE HCL 1 MG/ML IJ SOLN: 0.2500 mg | INTRAMUSCULAR | Status: DC | PRN

## 2018-06-17 NOTE — H&P (Signed)
Lauren Smith is an 72 y.o. female.   Chief Complaint: Patient is here for EGD, ED and colonoscopy. HPI: Patient is 72 year old Caucasian female who presents with history of solid food dysphagia.  She has had it sporadically for several months but lately more frequently.  She has most difficulty with beef.  She points to midsternal area as site of bolus obstruction.  She had barium pill esophagogram.  She had abnormality in thoracic esophagus suggestive of small ulcer and nonobstructive Schatzki's ring.  She has been on ranitidine with control of her heartburn.  Because it has been recalled she has been switched to pantoprazole.  She says heartburn is well controlled with therapy.  She denies nausea vomiting melena or rectal bleeding.  She also has a history of colonic adenomas.  Last colonoscopy was in 2014.  She is due for surveillance colonoscopy. She does not take aspirin or other NSAIDs. Family history is positive for CRC in her father who was 60 at the time of diagnosis.  He underwent surgery and received only 1 cycle of chemotherapy.  He lived to be 34 and died of unrelated causes.    Past Medical History:  Diagnosis Date  . GERD (gastroesophageal reflux disease) 04/25/2018  . High blood pressure 04/25/2018  . High cholesterol 04/25/2018  . Hypothyroidism 04/25/2018  . Kidney stone   . Osteoarthritis     Past Surgical History:  Procedure Laterality Date  . ABDOMINAL HYSTERECTOMY     One ovary left.   . APPENDECTOMY     Carcinold  . CARPAL TUNNEL RELEASE     bilateral   . CHOLECYSTECTOMY    . KIDNEY STONE SURGERY     lithro  . TUBAL LIGATION      No family history on file. Social History:  reports that she has never smoked. She has never used smokeless tobacco. She reports that she does not drink alcohol or use drugs.  Allergies:  Allergies  Allergen Reactions  . Penicillins Hives and Other (See Comments)    Has patient had a PCN reaction causing immediate rash,  facial/tongue/throat swelling, SOB or lightheadedness with hypotension: No Has patient had a PCN reaction causing severe rash involving mucus membranes or skin necrosis: No Has patient had a PCN reaction that required hospitalization: No Has patient had a PCN reaction occurring within the last 10 years: No If all of the above answers are "NO", then may proceed with Cephalosporin use.     Medications Prior to Admission  Medication Sig Dispense Refill  . b complex vitamins capsule Take 1 capsule by mouth daily.    . Cholecalciferol (VITAMIN D3) 125 MCG (5000 UT) CAPS Take 5,000 Units by mouth daily.     . Coenzyme Q10 100 MG capsule Take 100 mg by mouth daily.     Marland Kitchen levothyroxine (SYNTHROID, LEVOTHROID) 88 MCG tablet Take 88 mcg by mouth daily before breakfast.     . lisinopril (PRINIVIL,ZESTRIL) 10 MG tablet Take 10 mg by mouth daily.    . Lutein-Zeaxanthin 25-5 MG CAPS Take 1 capsule by mouth daily.    . Multiple Vitamin (MULTIVITAMIN) tablet Take 1 tablet by mouth daily.    Marland Kitchen OVER THE COUNTER MEDICATION Take 1 tablet by mouth 2 (two) times daily. Total Cardio Cover with Magnesium Supplement    . pantoprazole (PROTONIX) 40 MG tablet Take 1 tablet (40 mg total) by mouth daily. 90 tablet 1  . Probiotic Product (PROBIOTIC PO) Take 1 capsule by mouth daily.  No results found for this or any previous visit (from the past 48 hour(s)). No results found.  ROS  Blood pressure (!) 145/60, pulse 62, temperature 98.4 F (36.9 C), resp. rate 18, SpO2 99 %. Physical Exam  Constitutional: She appears well-developed and well-nourished.  HENT:  Mouth/Throat: Oropharynx is clear and moist.  Eyes: Conjunctivae are normal. No scleral icterus.  Neck: No thyromegaly present.  Cardiovascular: Normal rate, regular rhythm and normal heart sounds.  No murmur heard. Respiratory: Effort normal and breath sounds normal.  GI:  Abdomen is symmetrical with Pfannenstiel scar.  Abdomen is soft and nontender  with organomegaly or masses.  Musculoskeletal: She exhibits no edema.  Lymphadenopathy:    She has no cervical adenopathy.  Neurological: She is alert.  Skin: Skin is warm.     Assessment/Plan Esophageal dysphagia. Abnormal esophagogram. History of colonic polyps and family history of CRC in a first-degree relative at late onset. EGD with ED and surveillance colonoscopy.  Hildred Laser, MD 06/17/2018, 7:41 AM

## 2018-06-17 NOTE — Op Note (Signed)
[facility] Patient Name: Lauren Smith Procedure Date: 06/17/2018 7:57 AM MRN: [id number] Date of Birth: 1946-03-27 Attending MD: Hildred Laser , MD CSN: [id number] Age: 72 Admit Type: Outpatient Procedure:                Colonoscopy Indications:              High risk colon cancer surveillance: Personal                            history of colonic polyps, Family history of colon                            cancer in a first-degree relative Providers:                Hildred Laser, MD, Charlsie Quest. Theda Sers RN, RN, Aram Candela Referring MD:             Olena Mater, MD Medicines:                Propofol per Anesthesia Complications:            No immediate complications. Estimated Blood Loss:     Estimated blood loss was minimal. Procedure:                After obtaining informed consent, the colonoscope                            was passed under direct vision. Throughout the                            procedure, the patient's blood pressure, pulse, and                            oxygen saturations were monitored continuously. The                            [id number]-H190DL ([id number]) scope was introduced through                            the anus and advanced to the the cecum, identified                            by appendiceal orifice and ileocecal valve. The                            colonoscopy was performed without difficulty. The                            patient tolerated the procedure well. The quality                            of the bowel preparation was good. The ileocecal  valve, appendiceal orifice, and rectum were                            photographed. Scope In: 8:46:33 AM Scope Out: 9:04:36 AM Scope Withdrawal Time: 0 hours 10 minutes 21 seconds  Total Procedure Duration: 0 hours 18 minutes 3 seconds  Findings:      The perianal and digital rectal examinations were normal.      A 6 mm polyp was found in the  proximal ascending colon. The polyp was       sessile. The polyp was removed with a cold snare. Resection and       retrieval were complete. The pathology specimen was placed into Bottle       Number 2.      A few small-mouthed diverticula were found in the sigmoid colon.      The retroflexed view of the distal rectum and anal verge was normal and       showed no anal or rectal abnormalities. Impression:               - One 6 mm polyp in the proximal ascending colon,                            removed with a cold snare. Resected and retrieved.                           - Diverticulosis in the sigmoid colon. Moderate Sedation:      Per Anesthesia Care Recommendation:           - Patient has a contact number available for                            emergencies. The signs and symptoms of potential                            delayed complications were discussed with the                            patient. Return to normal activities tomorrow.                            Written discharge instructions were provided to the                            patient.                           - High fiber diet today.                           - Continue present medications.                           - No aspirin, ibuprofen, naproxen, or other                            non-steroidal anti-inflammatory drugs for 1 day.                           -  Await pathology results.                           - Repeat colonoscopy in 5 years for surveillance. Procedure Code(s):        --- Professional ---                           [phone], Colonoscopy, flexible; with removal of                            tumor(s), polyp(s), or other lesion(s) by snare                            technique Diagnosis Code(s):        --- Professional ---                           Z86.010, Personal history of colonic polyps                           D12.2, Benign neoplasm of ascending colon                           Z80.0, Family history of  malignant neoplasm of                            digestive organs                           K57.30, Diverticulosis of large intestine without                            perforation or abscess without bleeding CPT copyright 2018 American Medical Association. All rights reserved. The codes documented in this report are preliminary and upon coder review may  be revised to meet current compliance requirements. Hildred Laser, MD Hildred Laser, MD 06/17/2018 9:22:23 AM This report has been signed electronically. Number of Addenda: 0

## 2018-06-17 NOTE — Anesthesia Postprocedure Evaluation (Signed)
Anesthesia Post Note  Patient: Lauren Smith  Procedure(s) Performed: COLONOSCOPY WITH PROPOFOL (N/A ) ESOPHAGOGASTRODUODENOSCOPY (EGD) WITH PROPOFOL (N/A ) ESOPHAGEAL DILATION (N/A ) BIOPSY POLYPECTOMY  Patient location during evaluation: PACU Anesthesia Type: General Level of consciousness: awake and alert and oriented Pain management: pain level controlled Vital Signs Assessment: post-procedure vital signs reviewed and stable Respiratory status: spontaneous breathing Cardiovascular status: blood pressure returned to baseline Postop Assessment: no apparent nausea or vomiting Anesthetic complications: no     Last Vitals:  Vitals:   06/17/18 0730  BP: (!) 145/60  Pulse: 62  Resp: 18  Temp: 36.9 C  SpO2: 99%    Last Pain:  Vitals:   06/17/18 0822  PainSc: 0-No pain                 Maansi Wike

## 2018-06-17 NOTE — Op Note (Signed)
[facility] Patient Name: Lauren Smith Procedure Date: 06/17/2018 8:04 AM MRN: [id number] Date of Birth: 1945-10-27 Attending MD: Hildred Laser , MD CSN: [id number] Age: 72 Admit Type: Outpatient Procedure:                Upper GI endoscopy Indications:              Esophageal dysphagia, Abnormal esophagram Providers:                Hildred Laser, MD, Charlsie Quest. Theda Sers RN, RN, Aram Candela Referring MD:             Olena Mater, MD Medicines:                Lidocaine jelly, Propofol per Anesthesia Complications:            No immediate complications. Estimated Blood Loss:     Estimated blood loss was minimal. Procedure:                Pre-Anesthesia Assessment:                           - Prior to the procedure, a History and Physical                            was performed, and patient medications and                            allergies were reviewed. The patient's tolerance of                            previous anesthesia was also reviewed. The risks                            and benefits of the procedure and the sedation                            options and risks were discussed with the patient.                            All questions were answered, and informed consent                            was obtained. Prior Anticoagulants: The patient has                            taken no previous anticoagulant or antiplatelet                            agents. ASA Grade Assessment: II - A patient with                            mild systemic disease. After reviewing the risks  and benefits, the patient was deemed in                            satisfactory condition to undergo the procedure.                           After obtaining informed consent, the endoscope was                            passed under direct vision. Throughout the                            procedure, the patient's blood pressure, pulse, and                       oxygen saturations were monitored continuously. The                            GIF-H190 ([id number]) scope was introduced through the                            and advanced to the second part of duodenum. The                            upper GI endoscopy was accomplished without                            difficulty. The patient tolerated the procedure                            well. Scope In: 8:24:38 AM Scope Out: 8:42:39 AM Total Procedure Duration: 0 hours 18 minutes 1 second  Findings:      The examined esophagus was normal.      A non-obstructing Schatzki ring was found at the gastroesophageal       junction at 32 cm from incisors. The scope was withdrawn. Dilation was       performed with a Maloney dilator with no resistance at 66 Fr. The       dilation site was examined following endoscope reinsertion and showed no       change and no bleeding, mucosal tear or perforation. Ring was intact and       disrupted with focal biopsy.      A 2 cm hiatal hernia was present.      A few small sessile polyps with no stigmata of recent bleeding were       found in the gastric fundus and in the gastric body. Biopsies were taken       with a cold forceps for histology.      The exam of the stomach was otherwise normal.      The duodenal bulb and second portion of the duodenum were normal. Impression:               - Normal esophagus.                           - Non-obstructing Schatzki ring. Dilated and  disrupted as above,                           - 2 cm hiatal hernia.                           - A few gastric polyps. Biopsied.                           - Normal duodenal bulb and second portion of the                            duodenum.                           Comment: no esophageal abnormality noted                            corresponding to abnormality on esophagogram. Moderate Sedation:      Per Anesthesia Care Recommendation:            - Patient has a contact number available for                            emergencies. The signs and symptoms of potential                            delayed complications were discussed with the                            patient. Return to normal activities tomorrow.                            Written discharge instructions were provided to the                            patient.                           - Resume previous diet today.                           - Continue present medications.                           - No aspirin, ibuprofen, naproxen, or other                            non-steroidal anti-inflammatory drugs for 1 day.                           - Await pathology results. Procedure Code(s):        --- Professional ---                           [phone], Esophagogastroduodenoscopy, flexible,  transoral; with biopsy, single or multiple                           43450, Dilation of esophagus, by unguided sound or                            bougie, single or multiple passes Diagnosis Code(s):        --- Professional ---                           K22.2, Esophageal obstruction                           K44.9, Diaphragmatic hernia without obstruction or                            gangrene                           K31.7, Polyp of stomach and duodenum                           R13.14, Dysphagia, pharyngoesophageal phase                           R93.3, Abnormal findings on diagnostic imaging of                            other parts of digestive tract CPT copyright 2018 American Medical Association. All rights reserved. The codes documented in this report are preliminary and upon coder review may  be revised to meet current compliance requirements. Hildred Laser, MD Hildred Laser, MD 06/17/2018 9:18:41 AM This report has been signed electronically. Number of Addenda: 0

## 2018-06-17 NOTE — Discharge Instructions (Signed)
Esophagogastroduodenoscopy, Care After Refer to this sheet in the next few weeks. These instructions provide you with information about caring for yourself after your procedure. Your health care provider may also give you more specific instructions. Your treatment has been planned according to current medical practices, but problems sometimes occur. Call your health care provider if you have any problems or questions after your procedure. What can I expect after the procedure? After the procedure, it is common to have:  A sore throat.  Nausea.  Bloating.  Dizziness.  Fatigue.  Follow these instructions at home:  Do not eat or drink anything until the numbing medicine (local anesthetic) has worn off and your gag reflex has returned. You will know that the local anesthetic has worn off when you can swallow comfortably.  Do not drive for 24 hours if you received a medicine to help you relax (sedative).  If your health care provider took a tissue sample for testing during the procedure, make sure to get your test results. This is your responsibility. Ask your health care provider or the department performing the test when your results will be ready.  Keep all follow-up visits as told by your health care provider. This is important. Contact a health care provider if:  You cannot stop coughing.  You are not urinating.  You are urinating less than usual. Get help right away if:  You have trouble swallowing.  You cannot eat or drink.  You have throat or chest pain that gets worse.  You are dizzy or light-headed.  You faint.  You have nausea or vomiting.  You have chills.  You have a fever.  You have severe abdominal pain.  You have black, tarry, or bloody stools. This information is not intended to replace advice given to you by your health care provider. Make sure you discuss any questions you have with your health care provider. Document Released: 06/29/2012 Document  Revised: 12/19/2015 Document Reviewed: 06/06/2015 Elsevier Interactive Patient Education  2018 [employer].  Colonoscopy, Adult, Care After This sheet gives you information about how to care for yourself after your procedure. Your doctor may also give you more specific instructions. If you have problems or questions, call your doctor. Follow these instructions at home: General instructions   For the first 24 hours after the procedure: ? Do not drive or use machinery. ? Do not sign important documents. ? Do not drink alcohol. ? Do your daily activities more slowly than normal. ? Eat foods that are soft and easy to digest. ? Rest often.  Take over-the-counter or prescription medicines only as told by your doctor.  It is up to you to get the results of your procedure. Ask your doctor, or the department performing the procedure, when your results will be ready. To help cramping and bloating:  Try walking around.  Put heat on your belly (abdomen) as told by your doctor. Use a heat source that your doctor recommends, such as a moist heat pack or a heating pad. ? Put a towel between your skin and the heat source. ? Leave the heat on for 20-30 minutes. ? Remove the heat if your skin turns bright red. This is especially important if you cannot feel pain, heat, or cold. You can get burned. Eating and drinking  Drink enough fluid to keep your pee (urine) clear or pale yellow.  Return to your normal diet as told by your doctor. Avoid heavy or fried foods that are hard to digest.  Avoid drinking alcohol for as long as told by your doctor. Contact a doctor if:  You have blood in your poop (stool) 2-3 days after the procedure. Get help right away if:  You have more than a small amount of blood in your poop.  You see large clumps of tissue (blood clots) in your poop.  Your belly is swollen.  You feel sick to your stomach (nauseous).  You throw up (vomit).  You have a fever.  You  have belly pain that gets worse, and medicine does not help your pain. This information is not intended to replace advice given to you by your health care provider. Make sure you discuss any questions you have with your health care provider. Document Released: 08/15/2010 Document Revised: 04/06/2016 Document Reviewed: 04/06/2016 Elsevier Interactive Patient Education  2017 [employer].  No aspirin or NSAIDs for 24 hours. Resume other medications as before.  Take pantoprazole 30 minutes before breakfast daily. High-fiber diet. No driving for 24 hours. Physician will call with biopsy results.

## 2018-06-17 NOTE — Anesthesia Preprocedure Evaluation (Signed)
Anesthesia Evaluation  Patient identified by MRN, date of birth, ID band Patient awake    Reviewed: Allergy & Precautions, NPO status , Patient's Chart, lab work & pertinent test results  Airway Mallampati: II  TM Distance: >3 FB Neck ROM: Full    Dental no notable dental hx. (+) Teeth Intact   Pulmonary neg pulmonary ROS,    Pulmonary exam normal breath sounds clear to auscultation       Cardiovascular Exercise Tolerance: Good hypertension, Pt. on medications negative cardio ROS Normal cardiovascular examI Rhythm:Regular Rate:Normal     Neuro/Psych negative neurological ROS  negative psych ROS   GI/Hepatic Neg liver ROS, GERD  Medicated and Controlled,  Endo/Other  Hypothyroidism   Renal/GU Renal diseaseH/o mult stones in past   negative genitourinary   Musculoskeletal  (+) Arthritis , Osteoarthritis,    Abdominal   Peds negative pediatric ROS (+)  Hematology negative hematology ROS (+)   Anesthesia Other Findings   Reproductive/Obstetrics negative OB ROS                             Anesthesia Physical Anesthesia Plan  ASA: II  Anesthesia Plan: General   Post-op Pain Management:    Induction: Intravenous  PONV Risk Score and Plan:   Airway Management Planned: Nasal Cannula and Simple Face Mask  Additional Equipment:   Intra-op Plan:   Post-operative Plan:   Informed Consent: I have reviewed the patients History and Physical, chart, labs and discussed the procedure including the risks, benefits and alternatives for the proposed anesthesia with the patient or authorized representative who has indicated his/her understanding and acceptance.   Dental advisory given  Plan Discussed with: CRNA  Anesthesia Plan Comments:         Anesthesia Quick Evaluation

## 2018-06-17 NOTE — Transfer of Care (Signed)
Immediate Anesthesia Transfer of Care Note  Patient: Lauren Smith  Procedure(s) Performed: COLONOSCOPY WITH PROPOFOL (N/A ) ESOPHAGOGASTRODUODENOSCOPY (EGD) WITH PROPOFOL (N/A ) ESOPHAGEAL DILATION (N/A ) BIOPSY POLYPECTOMY  Patient Location: PACU  Anesthesia Type:General  Level of Consciousness: awake  Airway & Oxygen Therapy: Patient Spontanous Breathing  Post-op Assessment: Report given to RN  Post vital signs: Reviewed  Last Vitals:  Vitals Value Taken Time  BP    Temp    Pulse 53 06/17/2018  9:14 AM  Resp 16 06/17/2018  9:14 AM  SpO2 96 % 06/17/2018  9:14 AM  Vitals shown include unvalidated device data.  Last Pain:  Vitals:   06/17/18 0881  PainSc: 0-No pain         Complications: No apparent anesthesia complications

## 2018-06-22 ENCOUNTER — Encounter (HOSPITAL_COMMUNITY): Payer: Self-pay | Admitting: Internal Medicine

## 2018-07-25 ENCOUNTER — Other Ambulatory Visit (HOSPITAL_COMMUNITY): Payer: Self-pay | Admitting: Internal Medicine

## 2018-07-25 DIAGNOSIS — Z1231 Encounter for screening mammogram for malignant neoplasm of breast: Secondary | ICD-10-CM

## 2018-08-08 ENCOUNTER — Ambulatory Visit (HOSPITAL_COMMUNITY)
Admission: RE | Admit: 2018-08-08 | Discharge: 2018-08-08 | Disposition: A | Discharge status: Home / Self Care | Payer: Medicare Other | Source: Ambulatory Visit | Attending: Internal Medicine | Admitting: Internal Medicine

## 2018-08-08 ENCOUNTER — Encounter (HOSPITAL_COMMUNITY): Payer: Self-pay

## 2018-08-08 DIAGNOSIS — Z1231 Encounter for screening mammogram for malignant neoplasm of breast: Secondary | ICD-10-CM

## 2019-07-13 ENCOUNTER — Other Ambulatory Visit (HOSPITAL_COMMUNITY): Payer: Self-pay | Admitting: Internal Medicine

## 2019-07-13 DIAGNOSIS — Z1231 Encounter for screening mammogram for malignant neoplasm of breast: Secondary | ICD-10-CM

## 2019-08-14 ENCOUNTER — Ambulatory Visit (HOSPITAL_COMMUNITY)
Admission: RE | Admit: 2019-08-14 | Discharge: 2019-08-14 | Disposition: A | Discharge status: Home / Self Care | Payer: Medicare Other | Source: Ambulatory Visit | Attending: Internal Medicine | Admitting: Internal Medicine

## 2019-08-14 ENCOUNTER — Other Ambulatory Visit: Payer: Self-pay

## 2019-08-14 DIAGNOSIS — Z1231 Encounter for screening mammogram for malignant neoplasm of breast: Secondary | ICD-10-CM | POA: Insufficient documentation

## 2019-08-17 ENCOUNTER — Other Ambulatory Visit (HOSPITAL_COMMUNITY): Payer: Self-pay | Admitting: Internal Medicine

## 2019-08-17 DIAGNOSIS — R928 Other abnormal and inconclusive findings on diagnostic imaging of breast: Secondary | ICD-10-CM

## 2019-08-22 ENCOUNTER — Encounter (HOSPITAL_COMMUNITY): Payer: Medicare Other

## 2019-08-22 ENCOUNTER — Ambulatory Visit (HOSPITAL_COMMUNITY): Payer: Medicare Other

## 2019-08-29 ENCOUNTER — Ambulatory Visit (HOSPITAL_COMMUNITY): Admission: RE | Admit: 2019-08-29 | Payer: Medicare Other | Source: Ambulatory Visit

## 2019-08-29 ENCOUNTER — Ambulatory Visit (HOSPITAL_COMMUNITY)
Admission: RE | Admit: 2019-08-29 | Discharge: 2019-08-29 | Disposition: A | Discharge status: Home / Self Care | Payer: Medicare Other | Source: Ambulatory Visit | Attending: Internal Medicine | Admitting: Internal Medicine

## 2019-08-29 ENCOUNTER — Other Ambulatory Visit: Payer: Self-pay

## 2019-08-29 DIAGNOSIS — R928 Other abnormal and inconclusive findings on diagnostic imaging of breast: Secondary | ICD-10-CM | POA: Insufficient documentation

## 2020-08-01 ENCOUNTER — Other Ambulatory Visit (HOSPITAL_COMMUNITY): Payer: Self-pay | Admitting: Internal Medicine

## 2020-08-01 DIAGNOSIS — Z1231 Encounter for screening mammogram for malignant neoplasm of breast: Secondary | ICD-10-CM

## 2020-09-02 ENCOUNTER — Other Ambulatory Visit: Payer: Self-pay

## 2020-09-02 ENCOUNTER — Ambulatory Visit (HOSPITAL_COMMUNITY)
Admission: RE | Admit: 2020-09-02 | Discharge: 2020-09-02 | Disposition: A | Discharge status: Home / Self Care | Payer: Medicare Other | Source: Ambulatory Visit | Attending: Internal Medicine | Admitting: Internal Medicine

## 2020-09-02 DIAGNOSIS — Z1231 Encounter for screening mammogram for malignant neoplasm of breast: Secondary | ICD-10-CM | POA: Diagnosis not present

## 2021-08-05 ENCOUNTER — Other Ambulatory Visit (HOSPITAL_COMMUNITY): Payer: Self-pay | Admitting: Internal Medicine

## 2021-08-05 DIAGNOSIS — Z1231 Encounter for screening mammogram for malignant neoplasm of breast: Secondary | ICD-10-CM

## 2021-09-05 ENCOUNTER — Ambulatory Visit (HOSPITAL_COMMUNITY)
Admission: RE | Admit: 2021-09-05 | Discharge: 2021-09-05 | Disposition: A | Discharge status: Home / Self Care | Payer: Medicare Other | Source: Ambulatory Visit | Attending: Internal Medicine | Admitting: Internal Medicine

## 2021-09-05 ENCOUNTER — Other Ambulatory Visit: Payer: Self-pay

## 2021-09-05 DIAGNOSIS — Z1231 Encounter for screening mammogram for malignant neoplasm of breast: Secondary | ICD-10-CM | POA: Diagnosis present

## 2022-06-08 ENCOUNTER — Encounter (INDEPENDENT_AMBULATORY_CARE_PROVIDER_SITE_OTHER): Payer: Self-pay | Admitting: Gastroenterology

## 2022-07-09 ENCOUNTER — Other Ambulatory Visit (HOSPITAL_COMMUNITY): Payer: Self-pay | Admitting: Internal Medicine

## 2022-07-09 DIAGNOSIS — Z1231 Encounter for screening mammogram for malignant neoplasm of breast: Secondary | ICD-10-CM

## 2022-09-07 ENCOUNTER — Ambulatory Visit (HOSPITAL_COMMUNITY)
Admission: RE | Admit: 2022-09-07 | Discharge: 2022-09-07 | Disposition: A | Discharge status: Home / Self Care | Payer: Medicare Other | Source: Ambulatory Visit | Attending: Internal Medicine | Admitting: Internal Medicine

## 2022-09-07 ENCOUNTER — Encounter (HOSPITAL_COMMUNITY): Payer: Self-pay

## 2022-09-07 DIAGNOSIS — Z1231 Encounter for screening mammogram for malignant neoplasm of breast: Secondary | ICD-10-CM | POA: Diagnosis present

## 2023-05-21 ENCOUNTER — Encounter (INDEPENDENT_AMBULATORY_CARE_PROVIDER_SITE_OTHER): Payer: Self-pay | Admitting: *Deleted

## 2023-07-15 IMAGING — MG MM DIGITAL SCREENING BILAT W/ TOMO AND CAD
8 series · 8 of 24 positions shown · non-contrast
Comparison: Previous exam(s).

CLINICAL DATA: Screening.

EXAM:
DIGITAL SCREENING BILATERAL MAMMOGRAM WITH TOMOSYNTHESIS AND CAD
TECHNIQUE: Bilateral screening digital craniocaudal and mediolateral oblique
mammograms were obtained. Bilateral screening digital breast
tomosynthesis was performed. The images were evaluated with
computer-aided detection.

[L CC synth-2D]
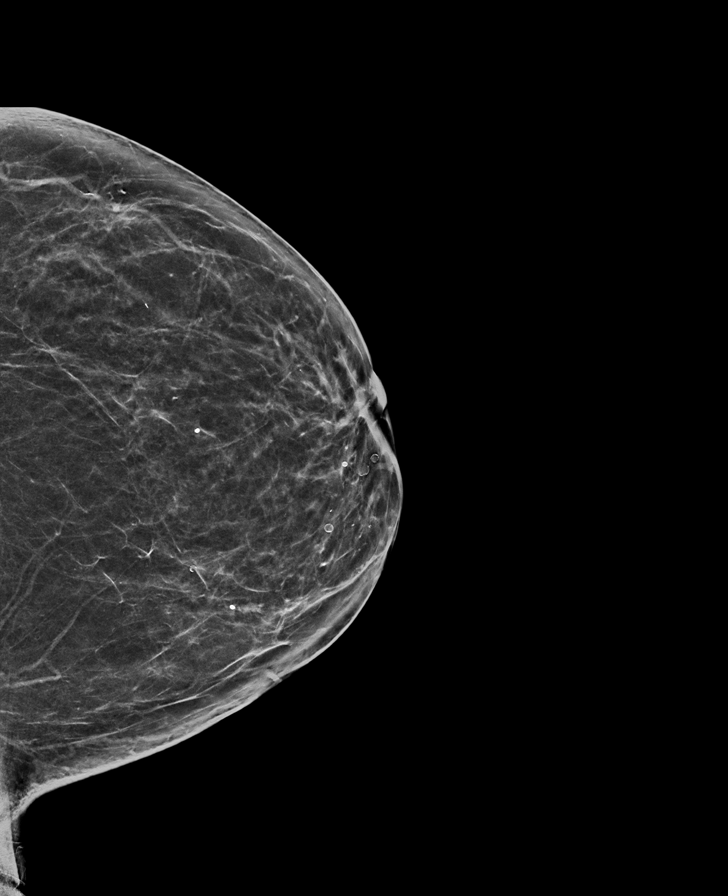

[R MLO synth-2D]
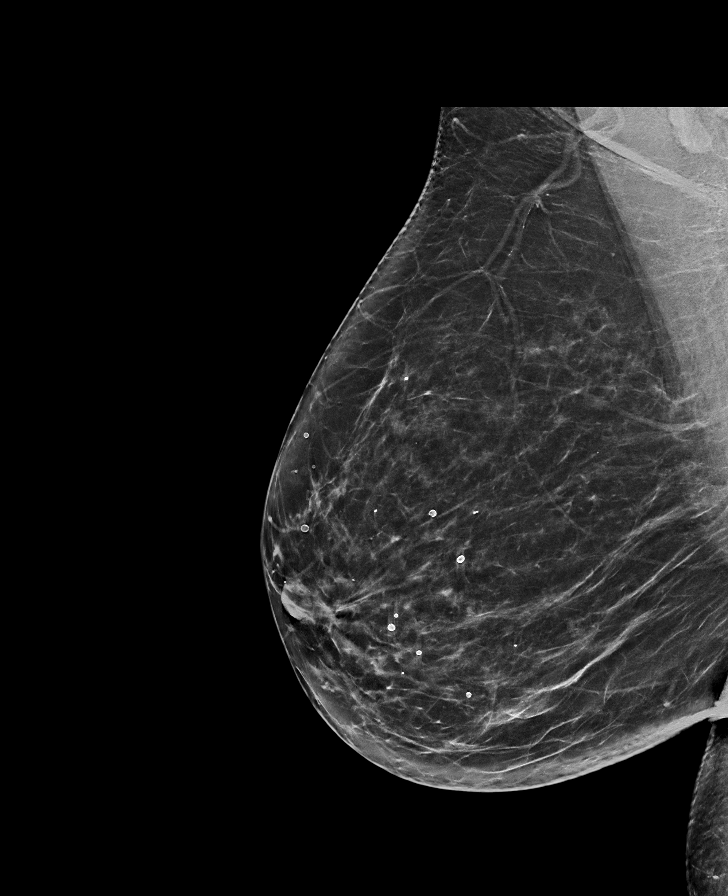

[R CC synth-2D]
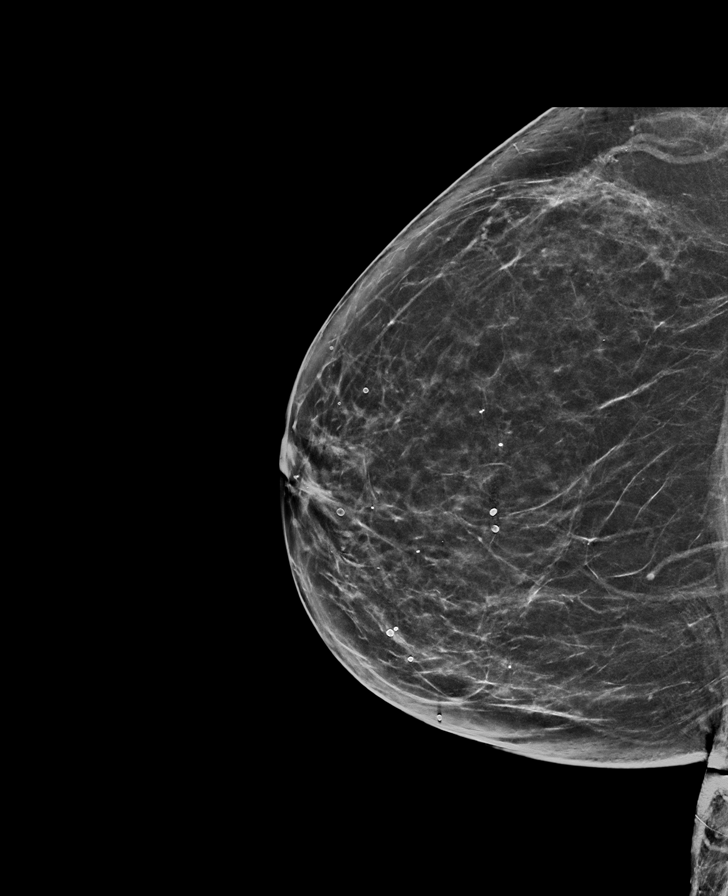

[L MLO synth-2D]
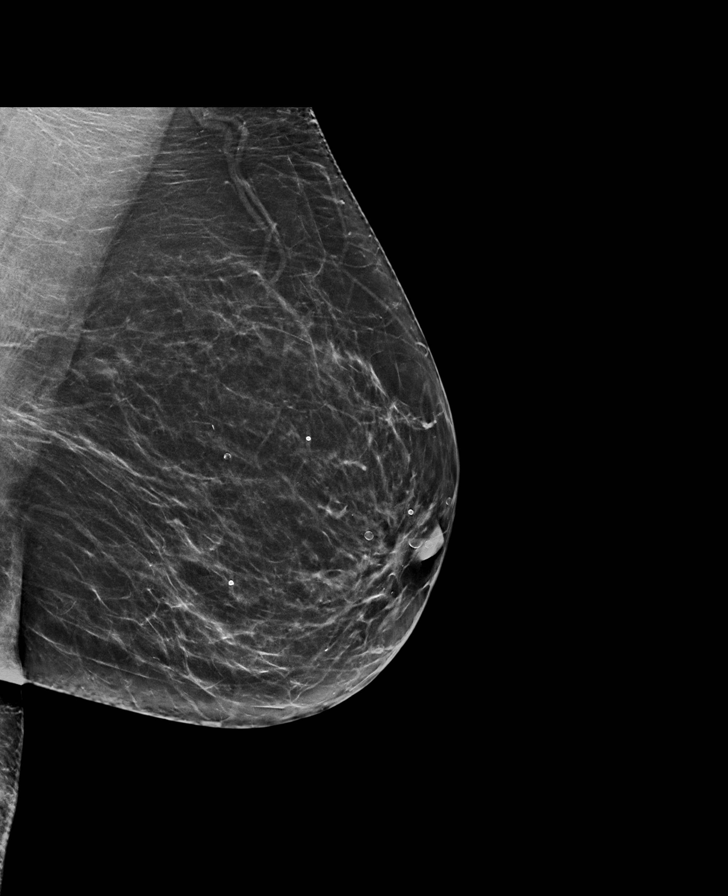

[R MLO tomo · tomo slice 40/79.0]
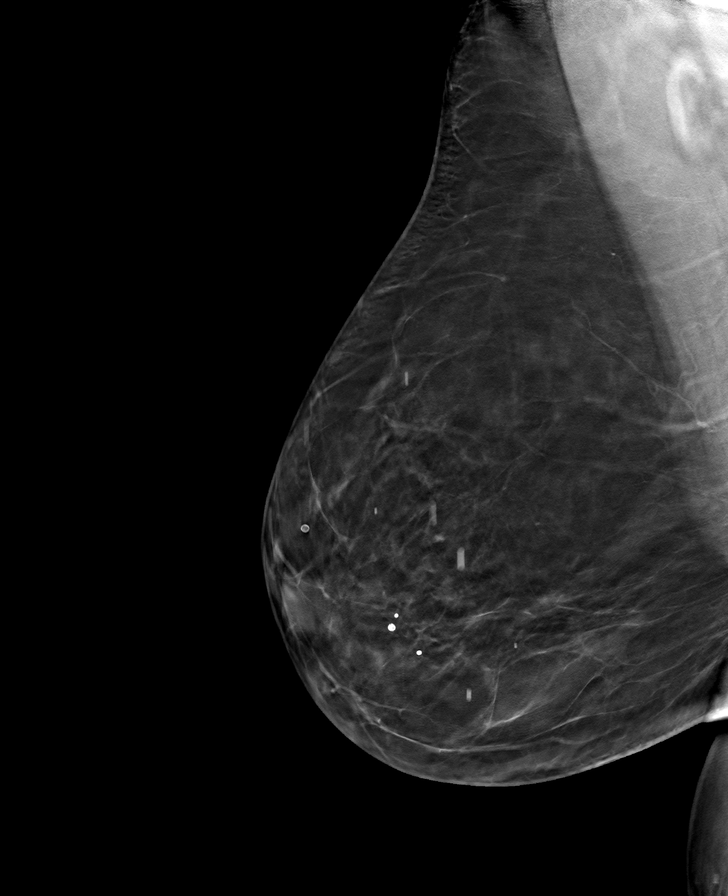

[R CC tomo · tomo slice 37/74.0]
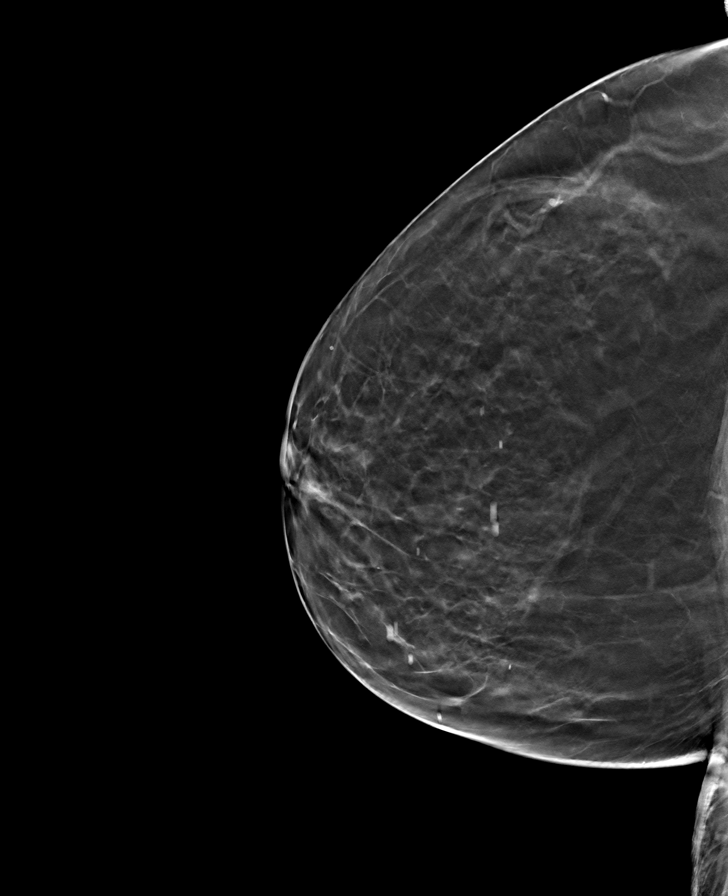

[L MLO tomo · tomo slice 37/74.0]
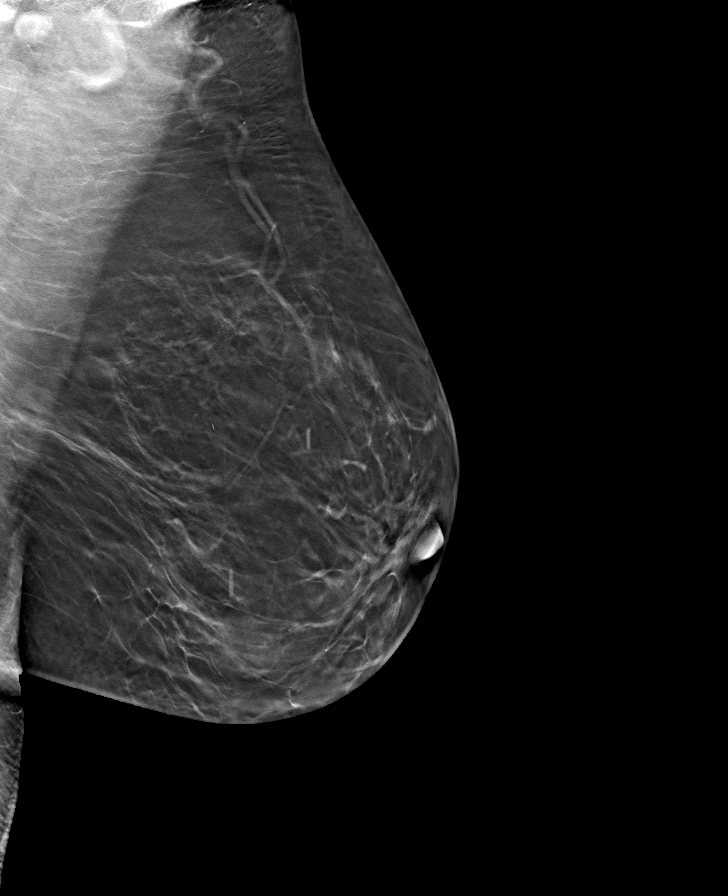

[L CC tomo · tomo slice 35/68.0]
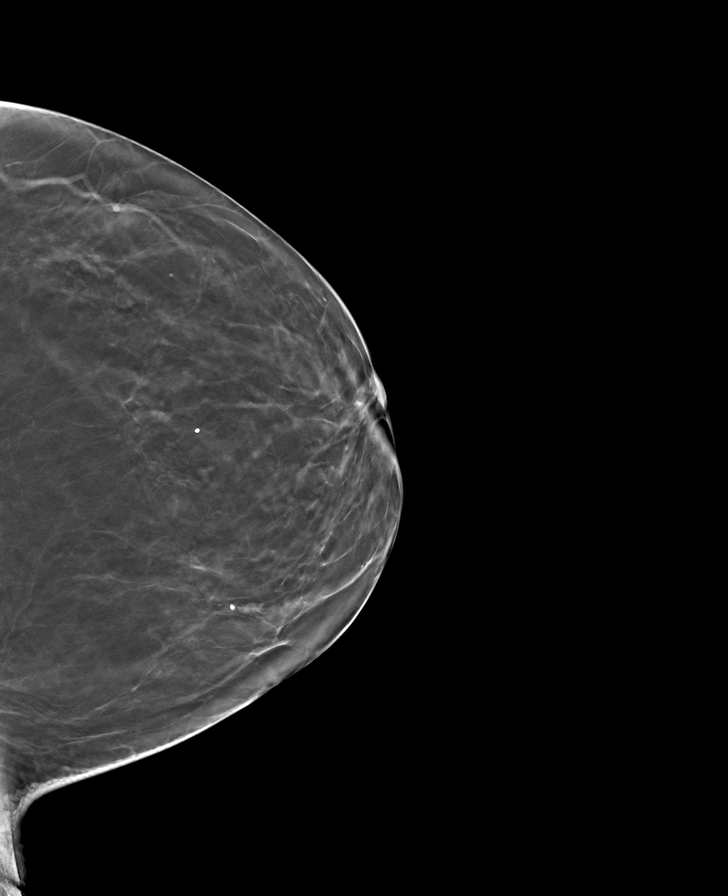

[8 of 24 positions shown; findings below may reference images not displayed]

ACR Breast Density Category b: There are scattered areas of
fibroglandular density.
FINDINGS: There are no findings suspicious for malignancy.
IMPRESSION: No mammographic evidence of malignancy. A result letter of this
screening mammogram will be mailed directly to the patient.

RECOMMENDATION:
Screening mammogram in one year. (Code:51-O-LD2)

BI-RADS CATEGORY  1: Negative.

## 2023-08-12 ENCOUNTER — Other Ambulatory Visit (HOSPITAL_COMMUNITY): Payer: Self-pay | Admitting: Internal Medicine

## 2023-08-12 DIAGNOSIS — Z1231 Encounter for screening mammogram for malignant neoplasm of breast: Secondary | ICD-10-CM

## 2023-09-13 ENCOUNTER — Encounter (HOSPITAL_COMMUNITY): Payer: Self-pay

## 2023-09-13 ENCOUNTER — Ambulatory Visit (HOSPITAL_COMMUNITY)
Admission: RE | Admit: 2023-09-13 | Discharge: 2023-09-13 | Disposition: A | Discharge status: Home / Self Care | Payer: Medicare Other | Source: Ambulatory Visit | Attending: Internal Medicine | Admitting: Internal Medicine

## 2023-09-13 DIAGNOSIS — Z1231 Encounter for screening mammogram for malignant neoplasm of breast: Secondary | ICD-10-CM | POA: Diagnosis present

## 2024-05-10 ENCOUNTER — Encounter (INDEPENDENT_AMBULATORY_CARE_PROVIDER_SITE_OTHER): Payer: Self-pay | Admitting: Gastroenterology

## 2024-07-25 ENCOUNTER — Other Ambulatory Visit (HOSPITAL_COMMUNITY): Payer: Self-pay | Admitting: Family Medicine

## 2024-07-25 DIAGNOSIS — N200 Calculus of kidney: Secondary | ICD-10-CM

## 2024-08-04 ENCOUNTER — Other Ambulatory Visit (HOSPITAL_COMMUNITY): Payer: Self-pay | Admitting: Family Medicine

## 2024-08-04 DIAGNOSIS — Z1231 Encounter for screening mammogram for malignant neoplasm of breast: Secondary | ICD-10-CM

## 2024-08-08 ENCOUNTER — Ambulatory Visit (HOSPITAL_COMMUNITY)
Admission: RE | Admit: 2024-08-08 | Discharge: 2024-08-08 | Disposition: A | Discharge status: Home / Self Care | Payer: PRIVATE HEALTH INSURANCE | Source: Ambulatory Visit | Attending: Family Medicine | Admitting: Family Medicine

## 2024-08-08 DIAGNOSIS — N200 Calculus of kidney: Secondary | ICD-10-CM | POA: Insufficient documentation

## 2024-08-08 MED ORDER — IOHEXOL 300 MG/ML  SOLN
100.0000 mL | Freq: Once | INTRAMUSCULAR | Status: AC | PRN
  Administered 2024-08-08: 100 mL via INTRAVENOUS

## 2024-09-15 ENCOUNTER — Ambulatory Visit (HOSPITAL_COMMUNITY): Payer: PRIVATE HEALTH INSURANCE
